# Patient Record
Sex: Female | Born: 1975 | Hispanic: Refuse to answer | Marital: Married
Health system: Western US, Academic
[De-identification: ages and names within clinical notes are randomized; demographics above are authoritative.]

---

## 2019-05-18 NOTE — Telephone Encounter (Signed)
Pt returned call and prefers in person also [place] cardi[place].

## 2019-05-18 NOTE — Telephone Encounter (Signed)
Left patient a message to return call to offer to convert her appointment with Dr. Elmon Kirschner to a video visit.    If patient returns call, please offer to convert to a video visit. Also ask if patient has seen a Cardiologist in the past to request medical records.    Thanks!

## 2019-05-19 ENCOUNTER — Ambulatory Visit: Admit: 2019-05-19 | Payer: MEDICAID | Attending: Physician | Primary: Physician

## 2019-05-19 DIAGNOSIS — E039 Hypothyroidism, unspecified: Secondary | ICD-10-CM

## 2019-05-19 DIAGNOSIS — M4802 Spinal stenosis, cervical region: Secondary | ICD-10-CM

## 2019-05-19 DIAGNOSIS — R079 Chest pain, unspecified: Secondary

## 2019-05-19 DIAGNOSIS — K589 Irritable bowel syndrome without diarrhea: Secondary | ICD-10-CM

## 2019-05-19 DIAGNOSIS — Z8249 Family history of ischemic heart disease and other diseases of the circulatory system: Secondary | ICD-10-CM

## 2019-05-19 DIAGNOSIS — R0789 Other chest pain: Secondary | ICD-10-CM

## 2019-05-19 MED ORDER — CELECOXIB 200 MG CAPSULE
200 | ORAL | Status: AC
Start: 2019-05-19 — End: ?

## 2019-05-19 MED ORDER — CYCLOBENZAPRINE 5 MG TABLET
5 | ORAL | Status: AC | PRN
Start: 2019-05-19 — End: ?

## 2019-05-19 MED ORDER — DULOXETINE 20 MG CAPSULE,DELAYED RELEASE
20 | ORAL | Status: AC
Start: 2019-05-19 — End: ?

## 2019-05-19 NOTE — Patient Instructions (Signed)
I recommend that you have an exercise stress test that should happen before your elective surgery. I will let you know about the results. Thanks!         Exercise Electrocardiogram: About This Test  What is it?     An exercise electrocardiogram (EKG or ECG) is a test that checks for changes in your heart while you exercise. Sometimes your doctor can only see heart problems during exercise or while you have symptoms.  This test is sometimes called an exercise EKG, stress test, or treadmill test.  Why is this test done?  You may need this test to check your heart's electrical activity. It can help find out if a heart problem is causing chest pain or find the cause of symptoms that happen during activity, such as dizziness or fainting. It can also help your doctor decide on the best treatment for certain heart problems.  How do you prepare for the test?   Do not smoke or eat a heavy meal before this test.   Wear flat, comfortable shoes (no bedroom slippers) and loose, lightweight shorts or sweatpants. Walking or running shoes are best.   Tell your doctor ALL the medicines, vitamins, supplements, and herbal remedies you take. Some may increase the risk of problems during your test. Your doctor will tell you if you should stop taking any of them before the test and how soon to do it.  How is the test done?  You most likely will either walk on a treadmill or pedal a stationary bicycle.  You will have a blood pressure cuff on your upper arm. Small pads or patches (electrodes) will be placed, like stickers, on your skin on each arm and leg and on your chest. Your doctor may wrap your chest with an elastic band to keep the electrodes from falling off.  On the treadmill, you will start out slowly in a level or slightly inclined position. After certain periods of time, the speed and steepness of the treadmill will be increased so that you will be walking faster and at a greater incline.   On the stationary bicycle, you will pedal fast enough to keep a certain speed. After certain periods of time, the resistance will be increased, making it harder to pedal.  In both tests:   Your EKG, heart rate, and blood pressure are recorded.   You might be asked to use numbers to say how hard you are exercising. The higher the number, the harder you think you are exercising.   The test will continue until:  ? You need to stop.  ? You have reached a target heart rate.  ? You have angina symptoms, such as chest pain or pressure.  ? You are very tired or very short of breath.  ? Your doctor feels you need to stop because of a change in your heartbeat or blood pressure.  After the test, you will be able to sit or lie down and rest. Your EKG and blood pressure will be checked for about 5 to 10 minutes.  What are the risks of the test?   There is very little chance of having a problem from this test.   Risks include:  ? Irregular heartbeats during the test.  ? Severe angina symptoms.  ? Fainting.  ? Falling.  ? Heart attack.   No electricity passes through your body during the test. There is no danger of getting an electrical shock.  How long does the test take?  The test may take about 30 to 60 minutes.  What happens after the test?   You will probably be able to go home right away. It depends on the reason for the test.   You can go back to your usual activities right away.  When should you call for help?  Call 911 anytime you think you may need emergency care. For example, call if:   You passed out (lost consciousness).   You have been diagnosed with angina, and you have angina symptoms that do not go away with rest or are not getting better within 5 minutes after you take a dose of nitroglycerin.   You have symptoms of a heart attack. These may include:  ? Chest pain or pressure, or a strange feeling in the chest.  ? Sweating.  ? Shortness of breath.  ? Nausea or vomiting.   ? Pain, pressure, or a strange feeling in the back, neck, jaw, or upper belly or in one or both shoulders or arms.  ? Lightheadedness or sudden weakness.  ? A fast or irregular pulse.  After you call 911, the operator may tell you to chew 1 adult-strength or 2 to 4 low-dose aspirin. Wait for an ambulance. Do not try to drive yourself.  Watch closely for changes in your health, and be sure to contact your doctor if you have any problems.  Follow-up care is a key part of your treatment and safety. Be sure to make and go to all appointments, and call your doctor if you are having problems. It's also a good idea to keep a list of the medicines you take. Ask your doctor when you can expect to have your test results.  Where can you learn more?  Go to [employer]  Enter Q933 in the search box to learn more about "Exercise Electrocardiogram: About This Test."  Current as of: August 31, 2020Content Version: 12.7   2006-2020 [employer].   Care instructions adapted under license by your healthcare professional. If you have questions about a medical condition or this instruction, always ask your healthcare professional. [employer], Inc[employer] disclaims any warranty or liability for your use of this information.

## 2019-05-19 NOTE — Progress Notes (Signed)
Subjective    Vickie Williams is an 44 y.o. female referred by [name], DO for chest pain.         History of Present Illness   43-yo woman with a history of cervical stenosis, IBS, hypothyroidism referred for chest pain.    She has two episodes of severe chest pain. The first episode of chest pain in 12/2018 occurred at rest. It was located at the left side of the chest and felt like a stabbing. Breathing would make the chest pain worse. It was a pain deep inside her chest. The chest pain lasted about 5-6 hours. There was associated shortness of breath. She had to go to [facility] for this. There her EKG and troponin were normal. It was thought that her chest pain was reproducible at the time. She received gabapentin and lidocaine patch and her chest pain subsequently resolved. The following day she had severe fatigue which resolved.    Then she had a second episode while in her surgeon's office in 03/2019. It was a severe left-sided chest pain that felt like something was stuck in her chest. She went to the [facility] ER but because she was not happy with her experience there, she went to the [facility] ER. By the time she went to the other ER, her chest pain had resolved. There she was diagnosed with right elbow pain.    She also endorses exertional chest discomfort and shortness of breath. This has been happening since she was in [place] in 2018. She says that when she walks up hills, she gets these symptoms. After she walks up a flight of stairs, she feels like she is going to collapse. Prior to these chest pains she was a runner and was more physically active.    She has been diagnosed with cervical myelopathy with "impending cord compression at C4-5 and C5-6" per the orthopedic surgeon. She is awaiting C4-5 and C5-6 ACDF on 3/30.    Many years ago she was told that her heart rate was low, in the 50s, but no intervention was deemed necessary.     She has had intermittent pain in both of her knees and ankles. She has been seen by rheumatology and was diagnosed with fibromyalgia.    The patient denies paroxysmal nocturnal dyspnea, orthopnea, palpitations, syncope, or presyncope.    Allergies/Contraindications   Allergen Reactions    Ibuprofen Shortness Of Breath     Abdominal pain   Abdominal pain       Mushroom Diarrhea     Cramps, diarrhea  Diarrhea/vomiting  Diarrhea/vomiting  Cramps, diarrhea      Shellfish Derived Rash     rash  rash      Fish Containing Products      Bodywide rash    Shrimp      Rash     Outpatient Encounter Medications as of 05/19/2019   Medication Sig Dispense Refill    cyclobenzaprine (FLEXERIL) 5 mg tablet Take 10 mg by mouth nightly as needed      DULoxetine (CYMBALTA) 20 mg DR capsule Take 20 mg by mouth      celecoxib (CELEBREX) 200 mg capsule Take 400 mg by mouth daily       No facility-administered encounter medications on file as of 05/19/2019.      Past Medical History:   Diagnosis Date    Cervical stenosis of spine     Irritable bowel syndrome     Thyroid disease  Past Surgical History:   Procedure Laterality Date    CESAREAN SECTION  2003    CHOLECYSTECTOMY  2017    ovary removal Right     around 2005     Family History   Problem Relation Name Age of Onset    Heart attack Father      Heart murmur Maternal Uncle      Pacemaker Maternal Grandmother         Social History     Tobacco Use    Smoking status: Never Smoker    Smokeless tobacco: Never Used   Substance and Sexual Activity    Alcohol use: Not Currently    Drug use: Never   Social History Narrative    Patient works as a IT sales professional.           Objective      Vitals      Most Recent Value   BP  121/81   Heart Rate  86   *Resp  20   SpO2  96 %   Weight  91.4 kg (201 lb 6.4 oz)   Height  160 cm (5\' 3" )   Pain Score  0   BMI (Calculated)  35.8              Physical Exam  GENERAL: Alert, well appearing, and in no acute distress.   EYES: Normal conjunctivae, no scleral icterus.    NECK: No thyromegaly.    LYMPH: No cervical lymphadenopathy.  RESPIRATORY: No respiratory distress. CTAB.  No wheezes or rhonchi.   CARDIOVASCULAR: Regular rhythm. Normal S1, S2. No murmurs, rubs, clicks or gallops. No JVD or hepatojugular reflux.  GI/ABDOMEN: Soft, nontender, nondistended and no organomegaly.  EXTREMITIES: No clubbing, cyanosis or edema.  SKIN: No clubbing or cyanosis; warm, well-perfused.    NEUROLOGIC: Alert and oriented to person, place, and situation.  Normal gait.  PSYCHIATRIC: Normal speech and affect.       Review of Prior Testing  DATA:  I have personally reviewed the following data:     ECG today: normal EKG, normal sinus rhythm, there are no previous tracings available for comparison.  I have independently reviewed and interpreted this ECG.     Assessment and Plan         [name] is a 44 y.o. female 43-yo woman with a history of cervical stenosis, IBS, hypothyroidism referred for chest pain.     1) Chest pain  - Patient apparently has had two types of chest pain, one of which she has had two discrete episodes and sounds very atypical for angina (isolated rest pains that sound more musculoskeletal or pleuritic in nature), and the other being consistently exertional. Her baseline EKG is normal. Her cardiovascular exam today is benign. Her pretest probability for CAD does not seem to be high, but I do not have a clear alternative explanation for her exertional symptoms. I recommend that she have an exercise EKG for risk stratification for CAD and to evaluate her chest pain. Given her diffuse joint pains as well as her spine disease, I am wondering if she could have some sort of an systemic inflammatory process; she has had an elevated CRP of 11.59 in 04/2019. However, her rheumatologist has deemed an autoimmune workup unnecessary.                   I reviewed external records from 3+ providers outside my specialty or healthcare organization as summarized in the note. I personally reviewed  and interpreted a test as summarized in the note.    I spent a total of 79 minutes on this patient's care on the day of their visit excluding time spent related to any billed procedures. This time includes face-to-face time with the patient as well as time spent documenting in the medical record, reviewing patient's records and tests, obtaining history, placing orders, communicating with other healthcare professionals, counseling the patient, family, or caregiver, and/or care coordination for the diagnoses above.                        I have provided the following written instructions to the patient in the After Visit Summary:   Patient Instructions   I recommend that you have an exercise stress test that should happen before your elective surgery. I will let you know about the results. Thanks!         Exercise Electrocardiogram: About This Test  What is it?     An exercise electrocardiogram (EKG or ECG) is a test that checks for changes in your heart while you exercise. Sometimes your doctor can only see heart problems during exercise or while you have symptoms.  This test is sometimes called an exercise EKG, stress test, or treadmill test.  Why is this test done?  You may need this test to check your heart's electrical activity. It can help find out if a heart problem is causing chest pain or find the cause of symptoms that happen during activity, such as dizziness or fainting. It can also help your doctor decide on the best treatment for certain heart problems.  How do you prepare for the test?   Do not smoke or eat a heavy meal before this test.   Wear flat, comfortable shoes (no bedroom slippers) and loose, lightweight shorts or sweatpants. Walking or running shoes are best.    Tell your doctor ALL the medicines, vitamins, supplements, and herbal remedies you take. Some may increase the risk of problems during your test. Your doctor will tell you if you should stop taking any of them before the test and how soon to do it.  How is the test done?  You most likely will either walk on a treadmill or pedal a stationary bicycle.  You will have a blood pressure cuff on your upper arm. Small pads or patches (electrodes) will be placed, like stickers, on your skin on each arm and leg and on your chest. Your doctor may wrap your chest with an elastic band to keep the electrodes from falling off.  On the treadmill, you will start out slowly in a level or slightly inclined position. After certain periods of time, the speed and steepness of the treadmill will be increased so that you will be walking faster and at a greater incline.  On the stationary bicycle, you will pedal fast enough to keep a certain speed. After certain periods of time, the resistance will be increased, making it harder to pedal.  In both tests:   Your EKG, heart rate, and blood pressure are recorded.   You might be asked to use numbers to say how hard you are exercising. The higher the number, the harder you think you are exercising.   The test will continue until:  ? You need to stop.  ? You have reached a target heart rate.  ? You have angina symptoms, such as chest pain or pressure.  ? You are very tired or very  short of breath.  ? Your doctor feels you need to stop because of a change in your heartbeat or blood pressure.  After the test, you will be able to sit or lie down and rest. Your EKG and blood pressure will be checked for about 5 to 10 minutes.  What are the risks of the test?   There is very little chance of having a problem from this test.   Risks include:  ? Irregular heartbeats during the test.  ? Severe angina symptoms.  ? Fainting.  ? Falling.  ? Heart attack.    No electricity passes through your body during the test. There is no danger of getting an electrical shock.  How long does the test take?  The test may take about 30 to 60 minutes.  What happens after the test?   You will probably be able to go home right away. It depends on the reason for the test.   You can go back to your usual activities right away.  When should you call for help?  Call 911 anytime you think you may need emergency care. For example, call if:   You passed out (lost consciousness).   You have been diagnosed with angina, and you have angina symptoms that do not go away with rest or are not getting better within 5 minutes after you take a dose of nitroglycerin.   You have symptoms of a heart attack. These may include:  ? Chest pain or pressure, or a strange feeling in the chest.  ? Sweating.  ? Shortness of breath.  ? Nausea or vomiting.  ? Pain, pressure, or a strange feeling in the back, neck, jaw, or upper belly or in one or both shoulders or arms.  ? Lightheadedness or sudden weakness.  ? A fast or irregular pulse.  After you call 911, the operator may tell you to chew 1 adult-strength or 2 to 4 low-dose aspirin. Wait for an ambulance. Do not try to drive yourself.  Watch closely for changes in your health, and be sure to contact your doctor if you have any problems.  Follow-up care is a key part of your treatment and safety. Be sure to make and go to all appointments, and call your doctor if you are having problems. It's also a good idea to keep a list of the medicines you take. Ask your doctor when you can expect to have your test results.  Where can you learn more?  Go to [employer]  Enter Q933 in the search box to learn more about "Exercise Electrocardiogram: About This Test."  Current as of: August 31, 2020Content Version: 12.7   2006-2020 [employer].    Care instructions adapted under license by your healthcare professional. If you have questions about a medical condition or this instruction, always ask your healthcare professional. [employer] disclaims any warranty or liability for your use of this information.

## 2019-05-22 LAB — ECG 12-LEAD
Atrial Rate: 80 {beats}/min
Calculated P Axis: 70 degrees
Calculated R Axis: 23 degrees
Calculated T Axis: 35 degrees
P-R Interval: 134 ms
QRS Duration: 84 ms
QT Interval: 370 ms
QTcb: 426 ms
Ventricular Rate: 80 {beats}/min

## 2019-05-24 ENCOUNTER — Ambulatory Visit: Admit: 2019-05-24 | Payer: MEDICAID | Primary: Physician

## 2019-05-24 DIAGNOSIS — R079 Chest pain, unspecified: Secondary | ICD-10-CM

## 2019-05-24 LAB — ECG STRESS REPORT

## 2019-05-24 NOTE — Progress Notes (Signed)
CARDIAC STRESS TEST PRE-PROCEDURE Interview    Referring Provider  Dr. Cannon Kettle    Procedure Planned  Treadmill Stress Test    Indication  Chest Pain    HPI  44 yo female with a medical hx that includes cervical stenosis, IBS, 7 hypothyroidism, here today for an exercise treadmill test with ECG to rule out ischemia.  Patient reports exertional chest pain that radiates to her neck & back.  She associates this with SOB as well as lightheadedness.  It takes a few hours for her symptoms to resolve with rest.  Also c/o sharp stabbing pain to the chest different from the sensation she experiences with activity.  This worsens with deep breaths and is fleeting.  Denies palpitations, dyspnea at rest, pre-syncope, or syncope.  Patient has not had anything to eat or drink for at least 3 hours except sips of water.    Exercise  Walking      PMH  Past Medical History:   Diagnosis Date    Cervical stenosis of spine     Irritable bowel syndrome     Thyroid disease        Family Hx  No known family hx of early heart disease    Allergies  Allergies/Contraindications   Allergen Reactions    Ibuprofen Shortness Of Breath     Abdominal pain   Abdominal pain       Mushroom Diarrhea     Cramps, diarrhea  Diarrhea/vomiting  Diarrhea/vomiting  Cramps, diarrhea      Shellfish Derived Rash     rash  rash      Fish Containing Products      Bodywide rash    Shrimp      Rash     Reviewed & verified.    Falls Hx  None    Medications  Current Medications       Dosage    celecoxib (CELEBREX) 200 mg capsule Take 400 mg by mouth daily    cyclobenzaprine (FLEXERIL) 5 mg tablet Take 10 mg by mouth nightly as needed    DULoxetine (CYMBALTA) 20 mg DR capsule Take 20 mg by mouth            Consent  Risks, benefits, and alternatives of procedure explained.  Discussed fall prevention while on the treadmill.  Consent obtained by Dr. Harrel Carina    Comments/Results  See ECG stress note for final report.     Jillyn Hidden, RN  Clinical Nurse   [facility]   [facility]  (phone) [phone]

## 2019-11-17 ENCOUNTER — Inpatient Hospital Stay: Admit: 2019-11-17 | Payer: MEDICAID | Primary: Physician

## 2019-11-17 DIAGNOSIS — N946 Dysmenorrhea, unspecified: Secondary

## 2019-12-18 ENCOUNTER — Inpatient Hospital Stay: Admit: 2019-12-18 | Payer: MEDICAID | Primary: Physician

## 2019-12-18 DIAGNOSIS — Z1231 Encounter for screening mammogram for malignant neoplasm of breast: Secondary

## 2022-04-17 IMAGING — [facility] [HOSPITAL]^LOMBAR
6 series · 48 of 48 positions shown · non-contrast
Comparison: none

[Series 2: coronal_t2_quiet · coronal · 5.5mm · 1.09mm/px · 8 of 15 slices shown]
[im 1/15]
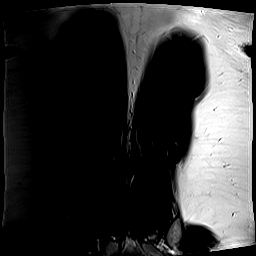
[im 3/15]
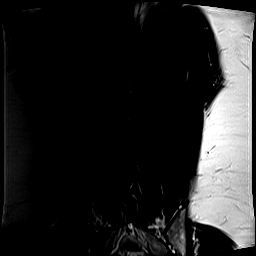
[im 5/15]
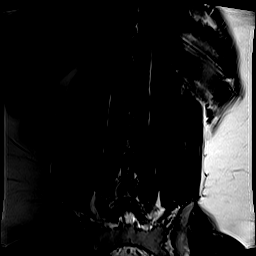
[im 7/15]
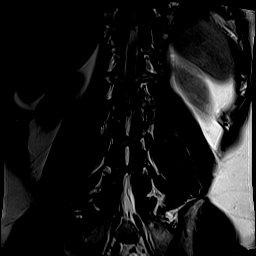
[im 9/15]
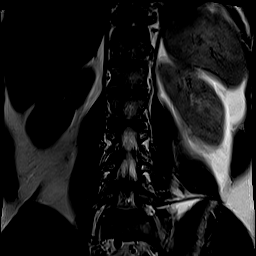
[im 11/15]
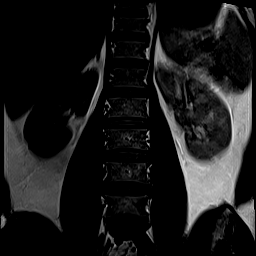
[im 13/15]
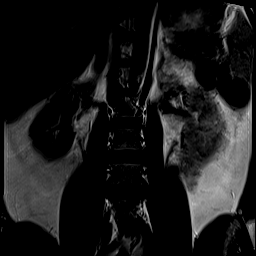
[im 15/15]
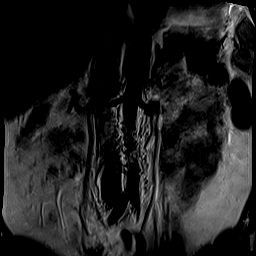

[Series 3: sagital_t2_quiet · sagittal · 4.0mm · 0.84mm/px · 5 of 12 slices shown]
[im 1/12]
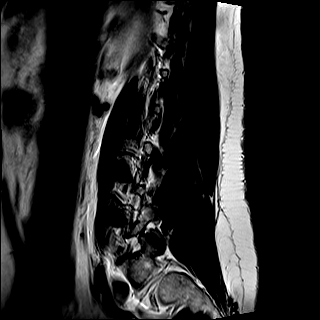
[im 3/12]
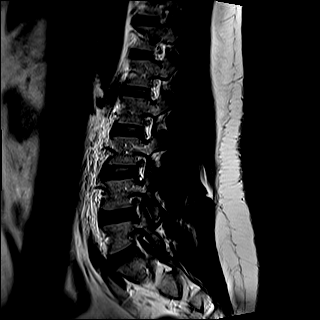
[im 6/12]
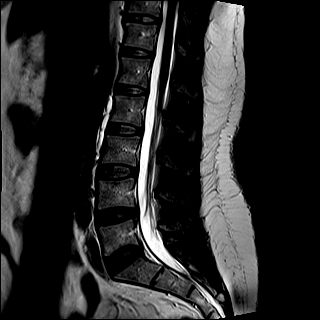
[im 9/12]
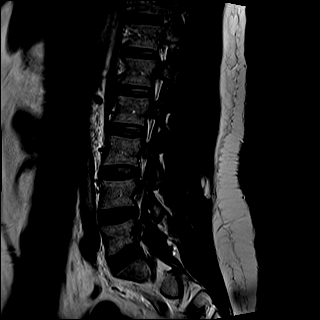
[im 12/12]
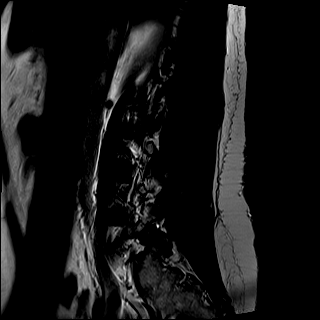

[Series 4: sagital_stir_quiet · sagittal · 4.0mm · 1.05mm/px · 5 of 12 slices shown]
[im 1/12]
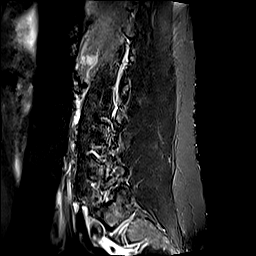
[im 3/12]
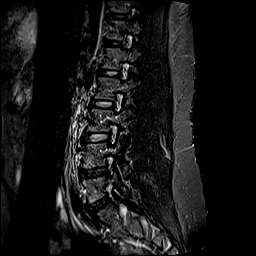
[im 6/12]
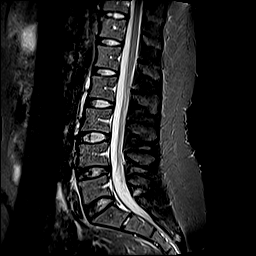
[im 9/12]
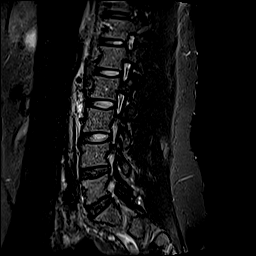
[im 12/12]
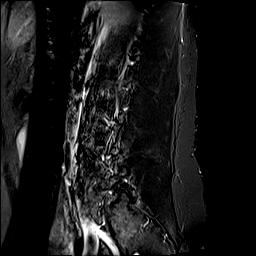

[Series 5: sagital_t1_quiet · sagittal · 4.0mm · 0.84mm/px · 5 of 12 slices shown]
[im 1/12]
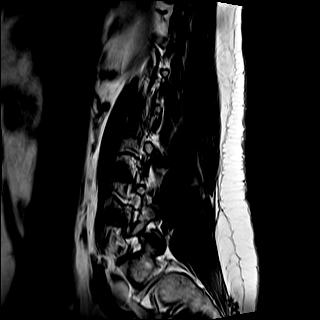
[im 3/12]
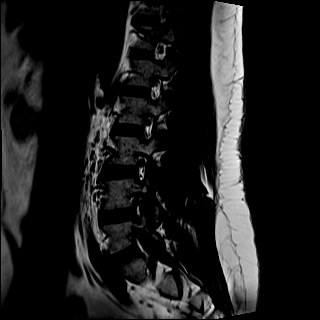
[im 6/12]
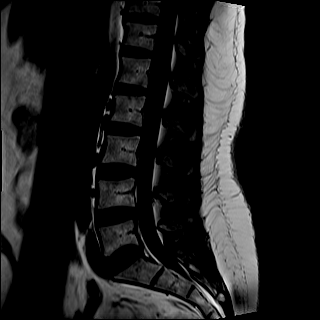
[im 9/12]
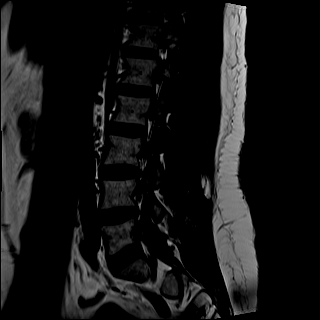
[im 12/12]
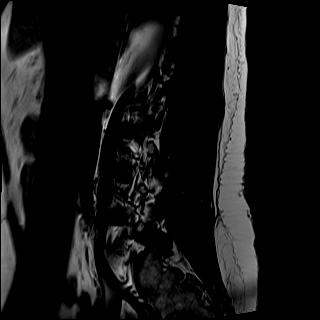

[Series 6: axial_t2_quiet · axial · 4.0mm · 0.69mm/px · z∈[-143,+75]mm · 11 of 25 slices shown]
[im 1/25]
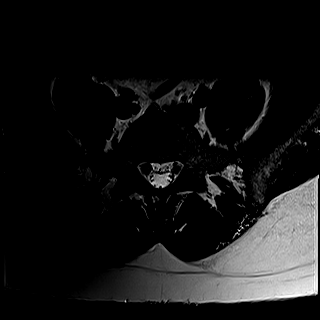
[im 3/25]
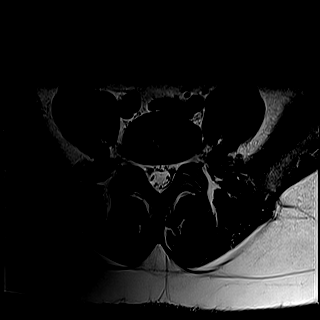
[im 5/25]
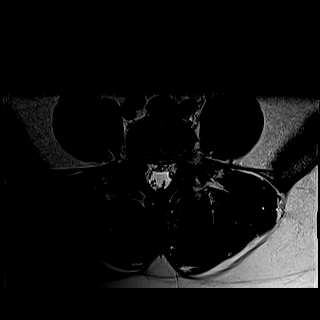
[im 8/25]
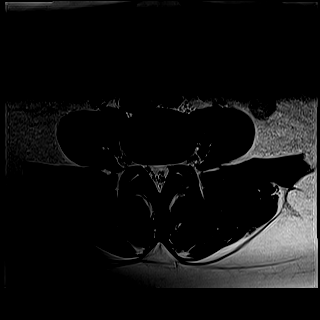
[im 10/25]
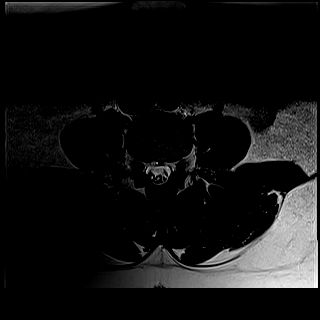
[im 13/25]
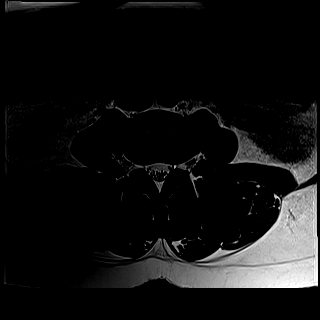
[im 15/25]
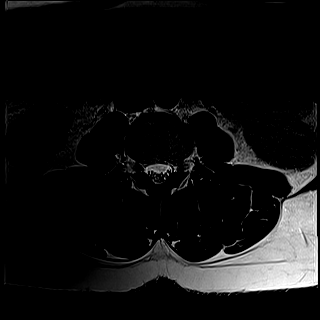
[im 17/25]
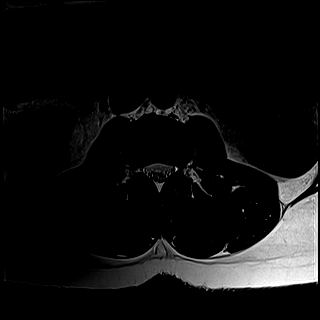
[im 20/25]
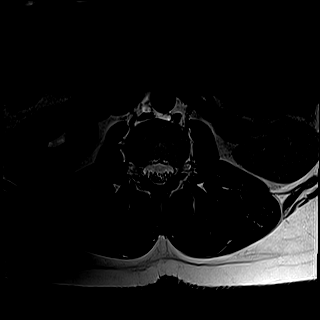
[im 22/25]
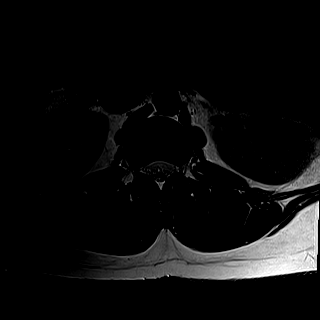
[im 25/25]
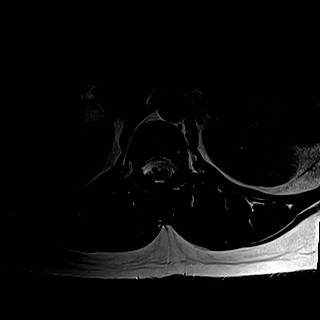

[Series 7: axial_t2_tse_bloco · axial · 4.0mm · 0.69mm/px · z∈[-116,+52]mm · 14 of 32 slices shown]
[im 1/32]
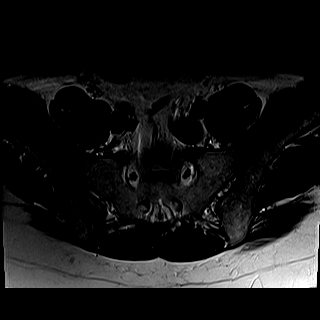
[im 3/32]
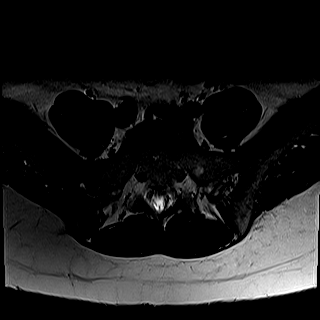
[im 5/32]
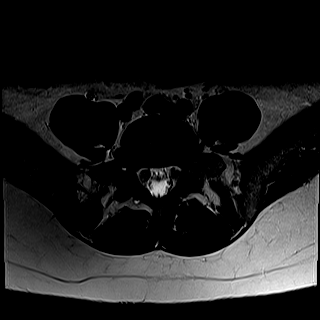
[im 8/32]
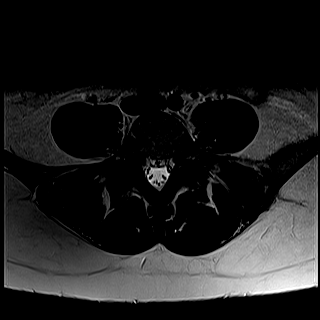
[im 10/32]
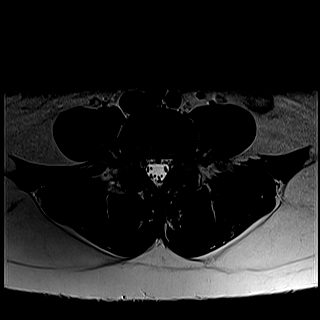
[im 12/32]
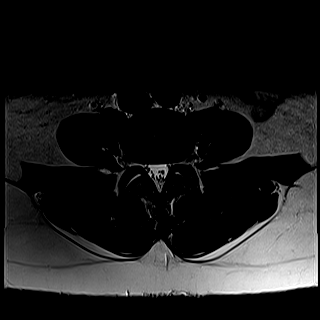
[im 15/32]
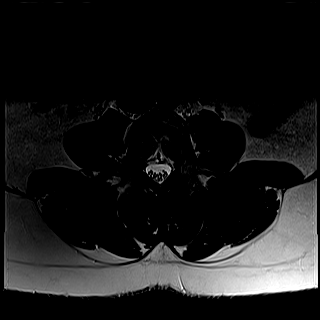
[im 17/32]
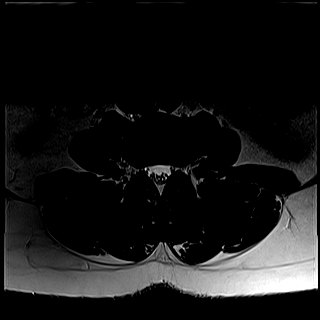
[im 20/32]
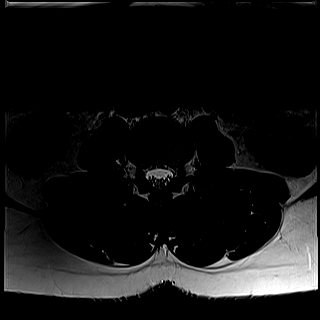
[im 22/32]
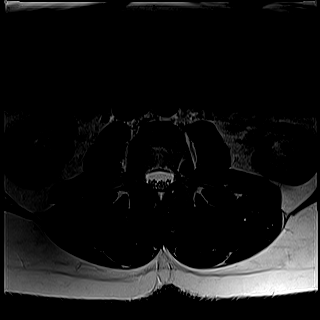
[im 24/32]
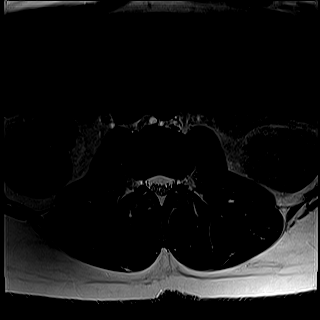
[im 27/32]
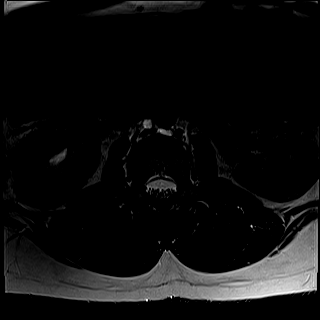
[im 29/32]
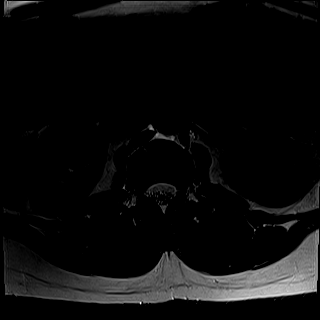
[im 32/32]
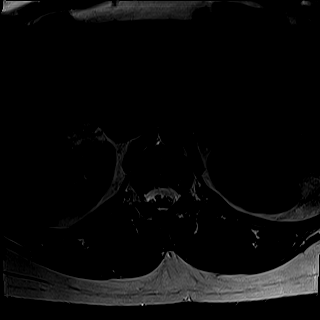

[48 of 48 positions shown; findings below may reference images not displayed]

RESSONÂNCIA MAGNÉTICA  [facility] LOMBOSSACRA

TÉCNICA:
Realizadas sequências FSE (fast-spin-eco), ponderadas em T1 e T2 nos planos sagital e axial.

RESULTADO:
Bom alinhamento dos corpos vertebrais lombares.
Acentuação da lordose lombar em decúbito.
Corpos vertebrais com alturas preservadas.
Osteofitose nos corpos vertebrais lombares.
Pedículos, laminas, processos transversos e espinhosos preservados.
Artrose interfacetária em L4-L5.
Sinais de desidratação discal em L5-S1. 
Nível L4-L5: Abaulamento discal simétrico com fissura do ânulo fibroso, retificando a face ventral do saco dural, sem conflitos radiculares.
Nível L5-S1: Abaulamento discal simétrico , retificando a face ventral do saco dural, sem conflitos radiculares.
Demais discos intervertebrais sem alterações significativas.
Canal vertebral e forames intervertebrais com amplitude preservada nos demais segmentos.
Espaço liquórico livre nos demais segmentos.
Raízes nervosas foraminais preservadas.
Cone medular com contornos regulares e sinal homogêneo.
Partes moles paravertebrais com aspecto preservado.

CONCLUSÃO:
Espondiloartropatia degenerativa lombar.
Discopatia degenerativa acima descrita.

## 2022-04-17 IMAGING — [facility] [HOSPITAL]^CERVICAL
4 of 5 series · 20 of 48 positions shown · non-contrast
Comparison: none

[Series 2: coronal_t2_quiet · coronal · 5.0mm · 0.88mm/px · 8 of 14 slices shown]
[im 1/14]
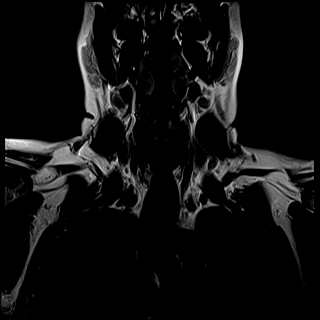
[im 2/14]
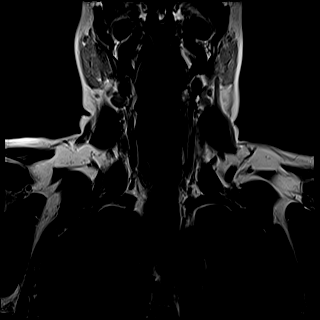
[im 4/14]
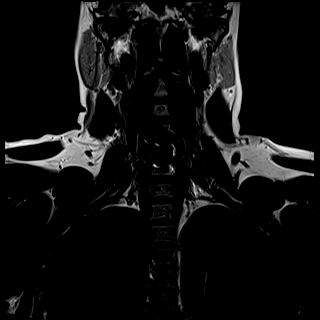
[im 6/14]
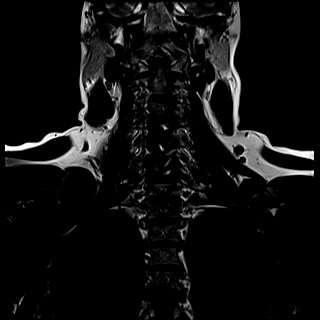
[im 8/14]
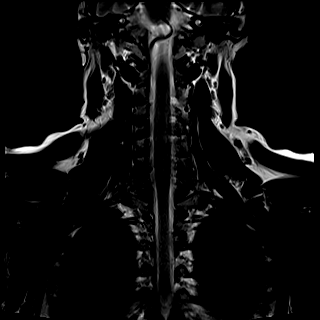
[im 10/14]
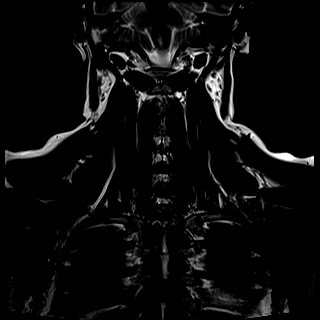
[im 12/14]
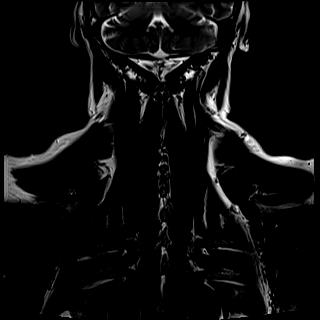
[im 14/14]
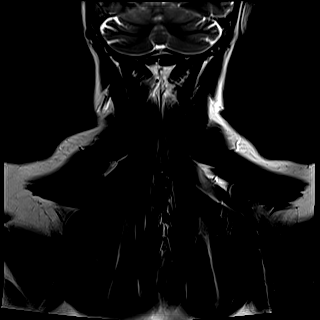

[Series 3: sagital_t2_tse · sagittal · 3.0mm · 0.27mm/px · 6 of 12 slices shown]
[im 1/12]
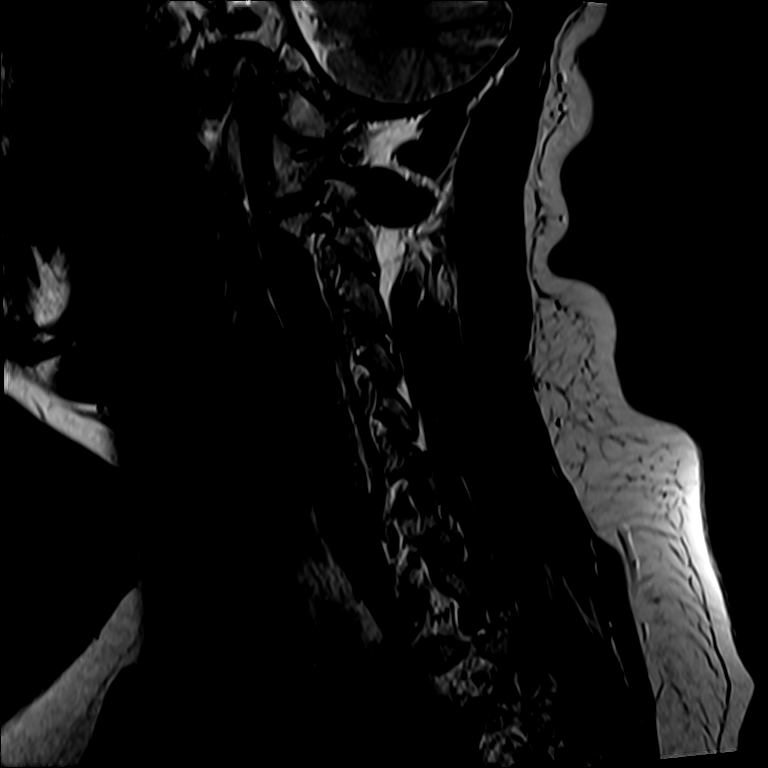
[im 2/12]
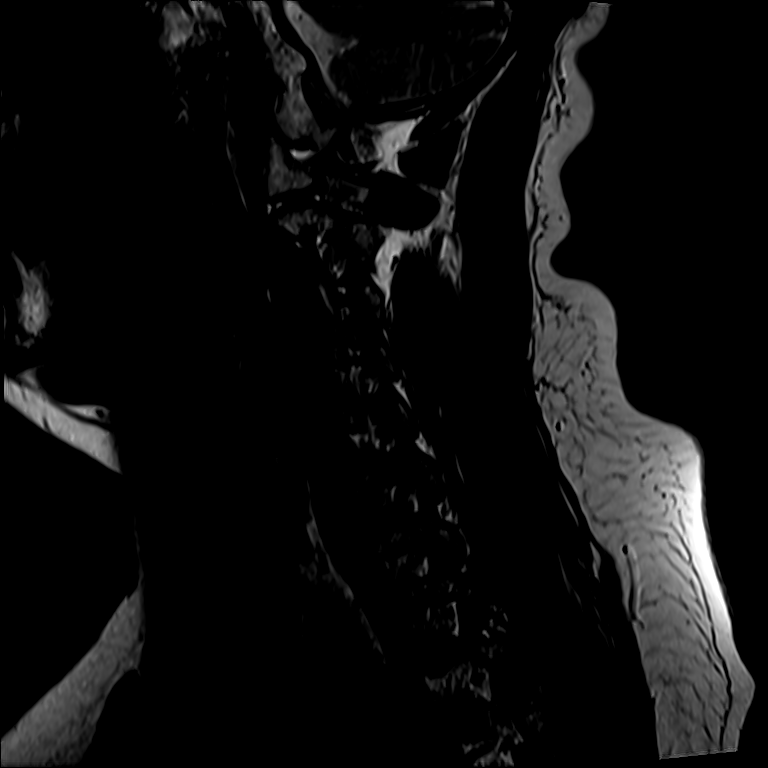
[im 4/12]
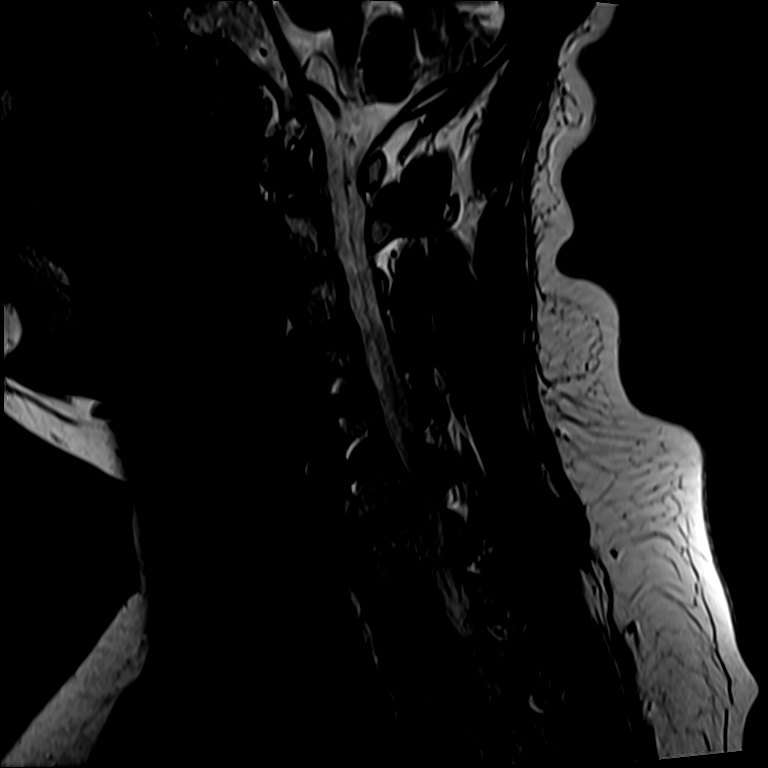
[im 5/12]
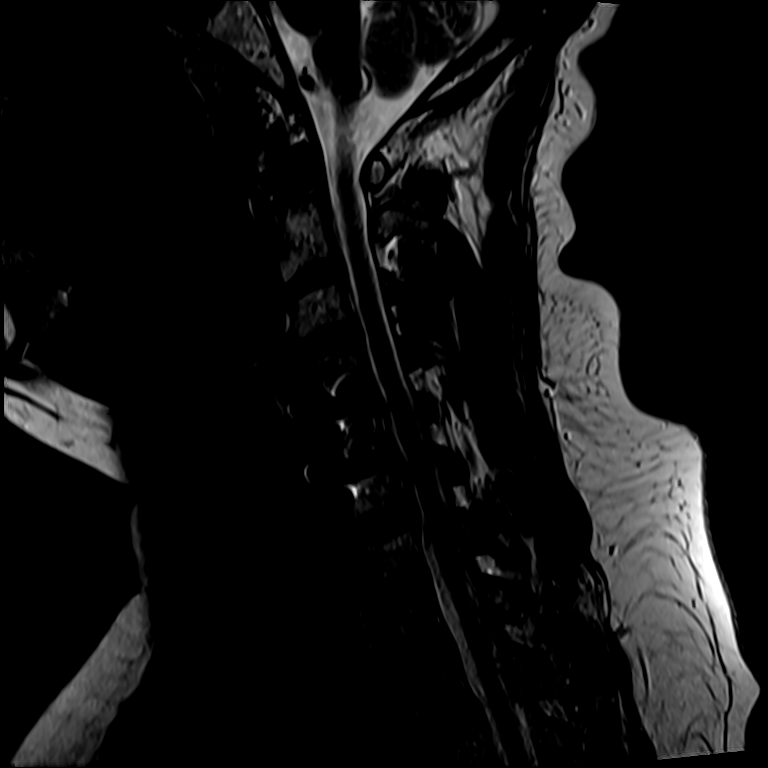
[im 7/12]
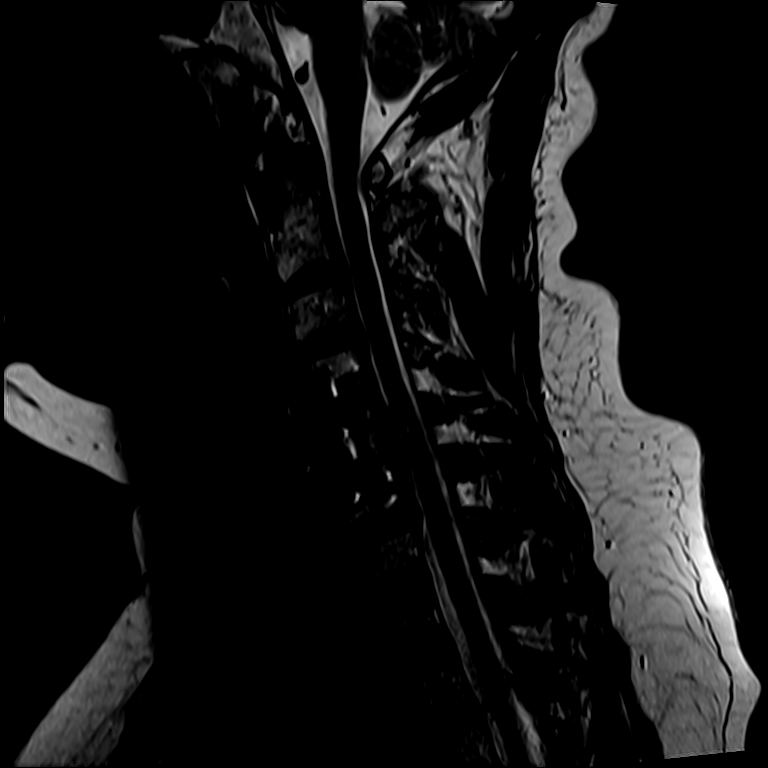
[im 10/12]
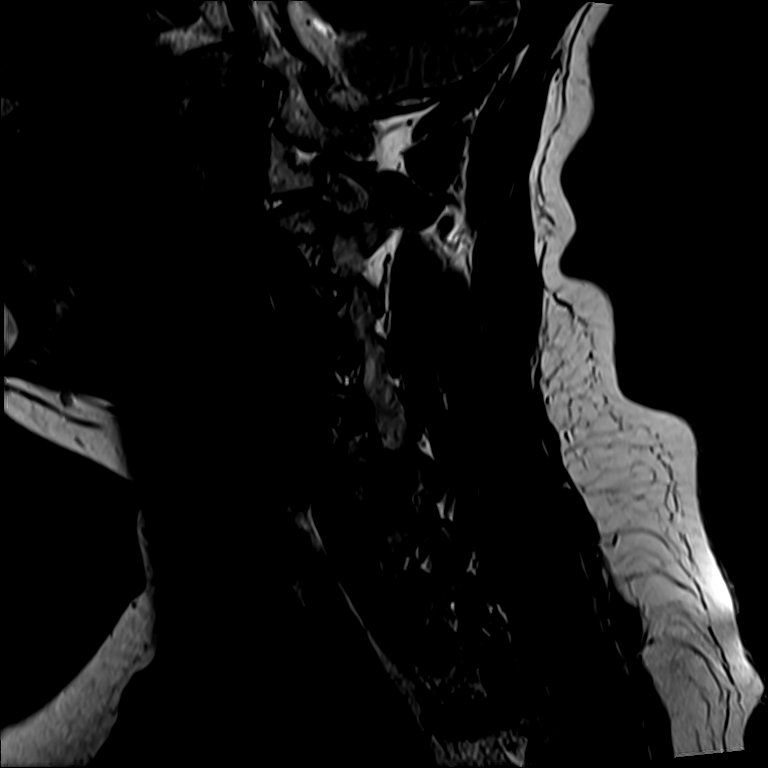

[Series 4: sagital_t1_tse · sagittal · 3.0mm · 0.66mm/px · 3 of 12 slices shown]
[im 2/12]
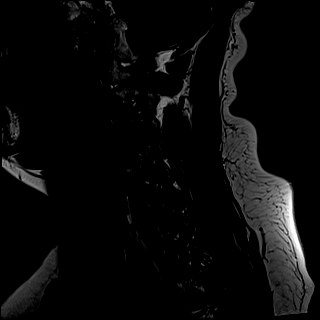
[im 7/12]
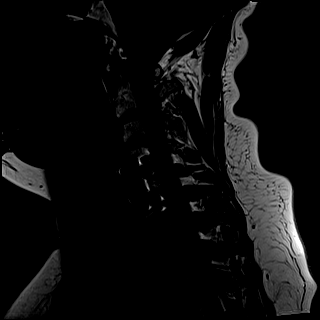
[im 10/12]
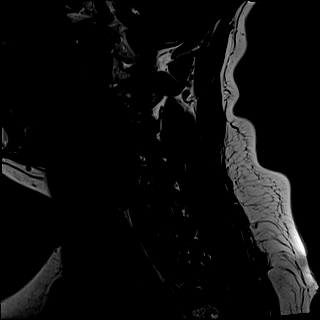

[Series 5: sagital_t2_stir · sagittal · 3.0mm · 0.82mm/px · 3 of 12 slices shown]
[im 2/12]
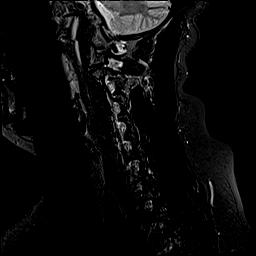
[im 7/12]
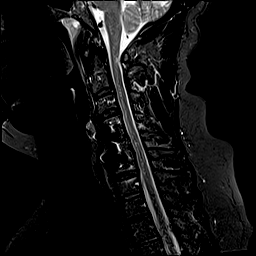
[im 10/12]
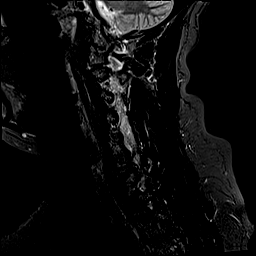

[20 of 48 positions shown; findings below may reference images not displayed]

RESSONÂNCIA MAGNÉTICA  [facility] CERVICAL

TÉCNICA:
Realizadas sequências FSE (fast-spin-eco), ponderadas em T1 e T2, no plano sagital e sequências ponderadas em T2 no plano axial.

RESULTADO:
Órteses discais em C4-C5 e C5-C6, gerando artefatos de susceptibilidade magnética que degradam algumas imagens.
Bom alinhamento dos corpos vertebrais cervicais.
Retificação da lordose cervical em decúbito.
Osteofitose dos corpos vertebrais cervicais.
Corpos vertebrais cervicais com alturas preservadas.
Pedículos, laminas, processos transversos e espinhosos preservados.
Articulações uncovertebrais e interfacetárias preservadas.
Abaulamentos discais simétricos em C3-C4 e C6-C7, retificando a face ventral do saco dural.
Demais discos intervertebrais sem alterações significativas.
Espaço liquórico livre nos demais segmentos.
Medula espinhal com contornos regulares e sinal homogêneo.
Canal vertebral e forames intervertebrais com amplitude preservada nos demais segmentos.
Transição crânio-cervical sem alterações.
Partes moles paravertebrais com aspecto preservado.

CONCLUSÃO:
Órteses discais em C4-C5 e C5-C6, gerando artefatos de susceptibilidade magnética que degradam algumas imagens.
Espondilose cervical.
Discopatia degenerativa acima descrita.

## 2023-02-26 IMAGING — CR Imaging study
8 of 10 series · 8 of 10 positions shown · non-contrast
Comparison: none

[oblique (1 of 2)]
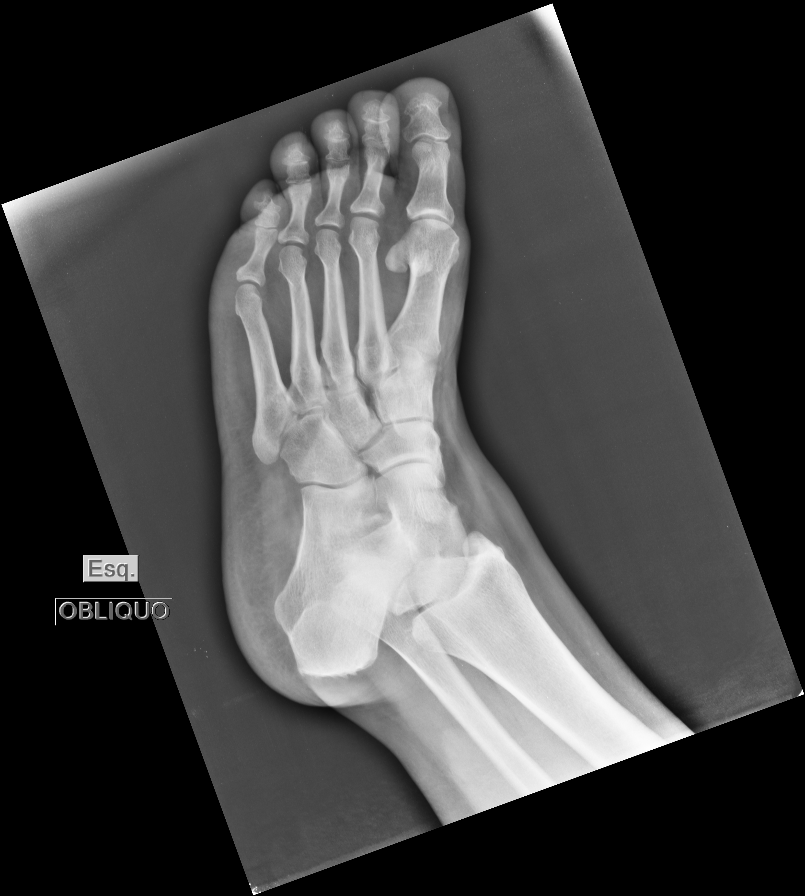

[oblique (2 of 2)]
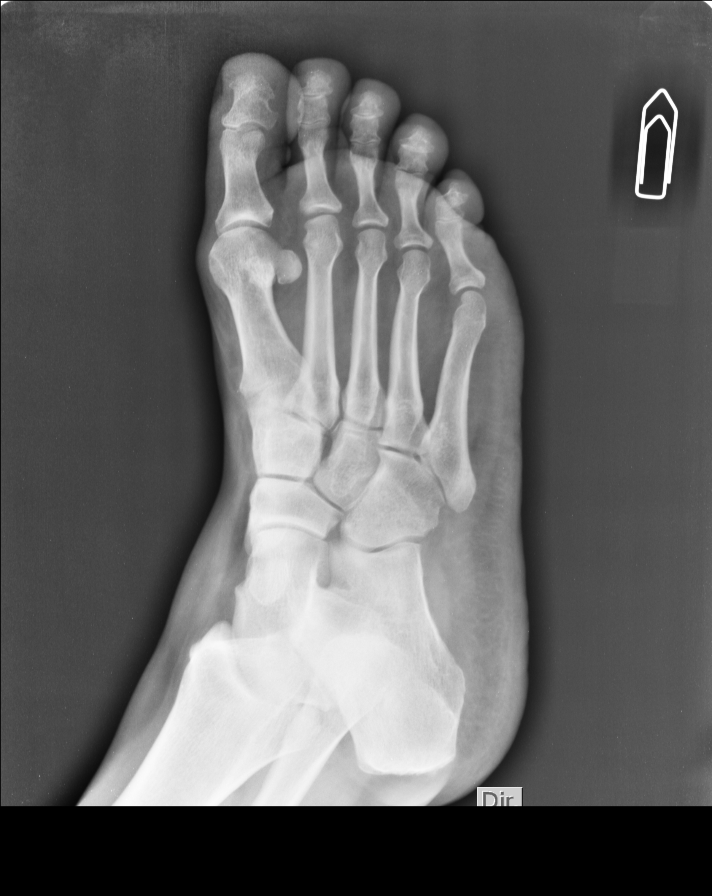

[AP (1 of 6)]
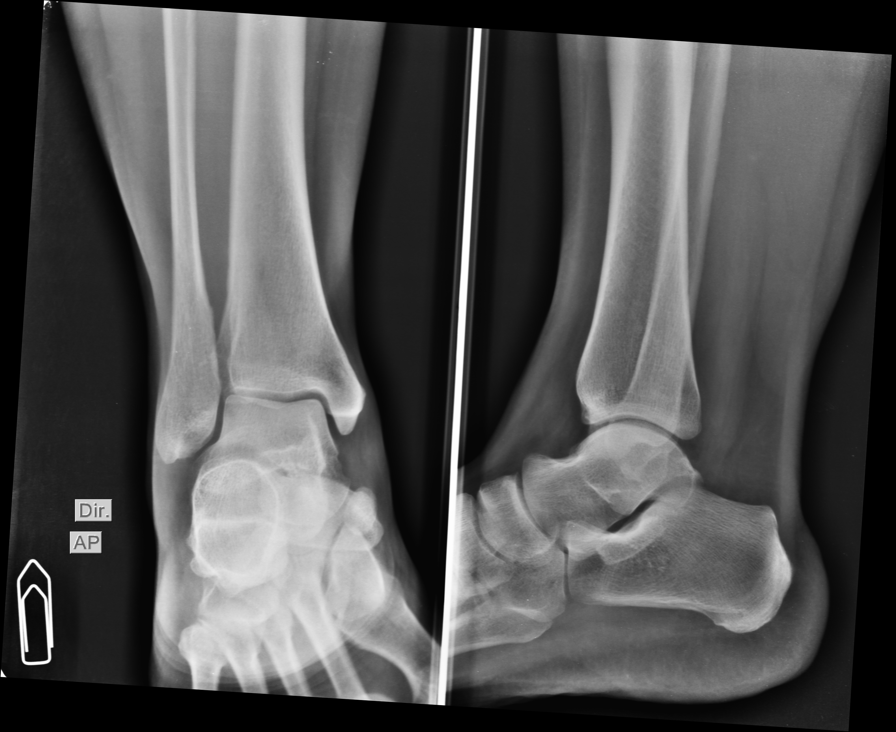

[AP (2 of 6)]
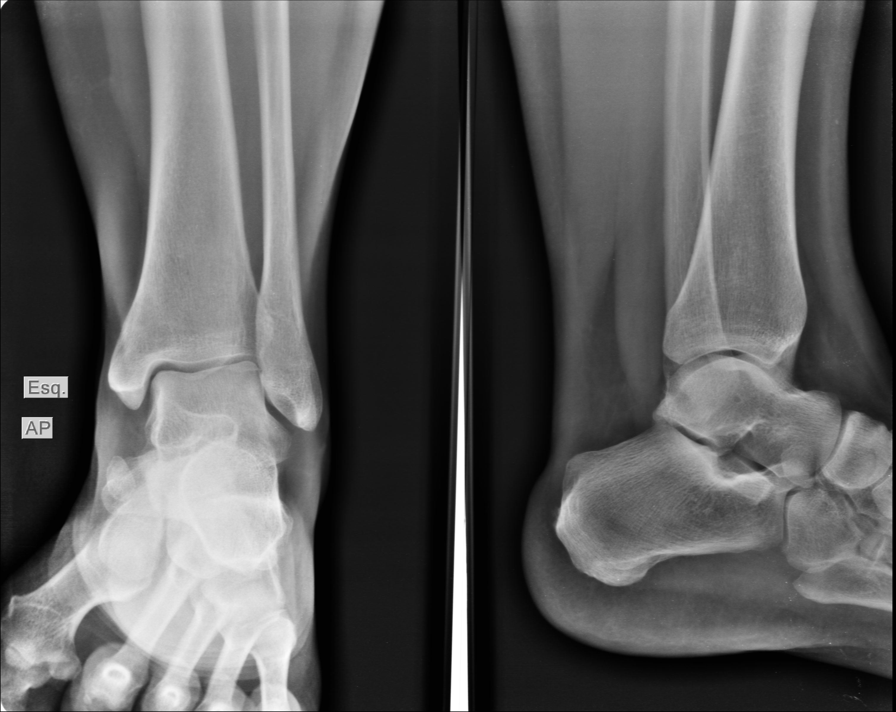

[AP (3 of 6)]
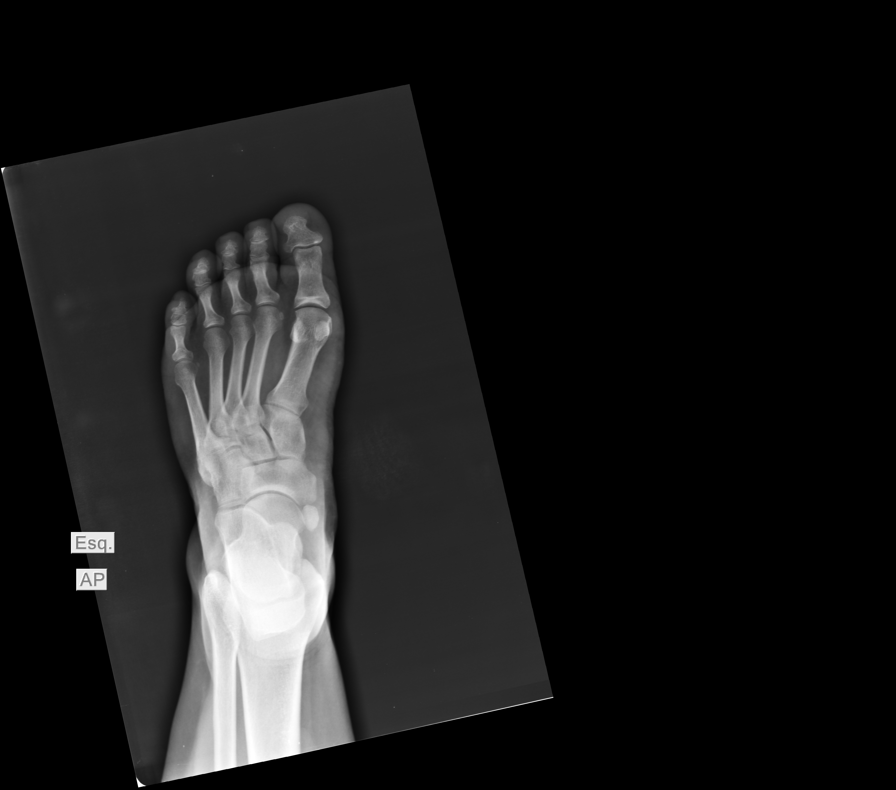

[AP (4 of 6)]
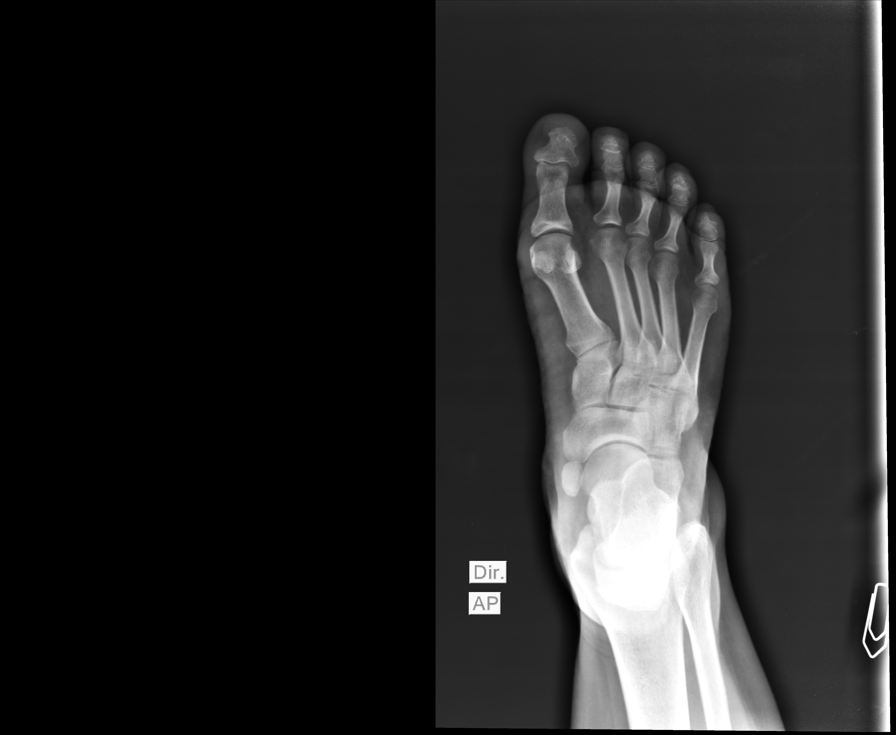

[AP (5 of 6)]
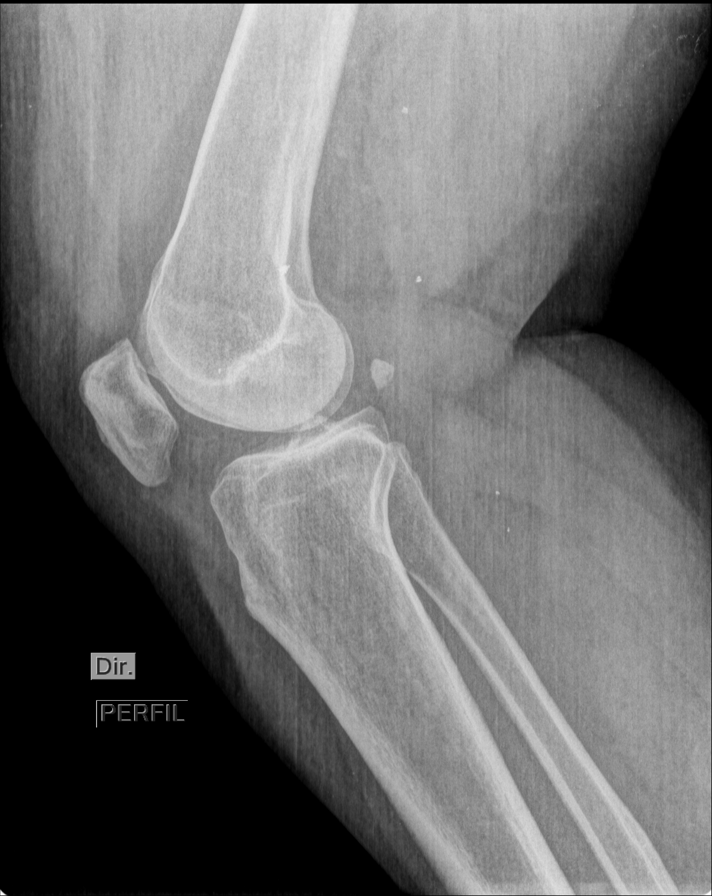

[AP (6 of 6)]
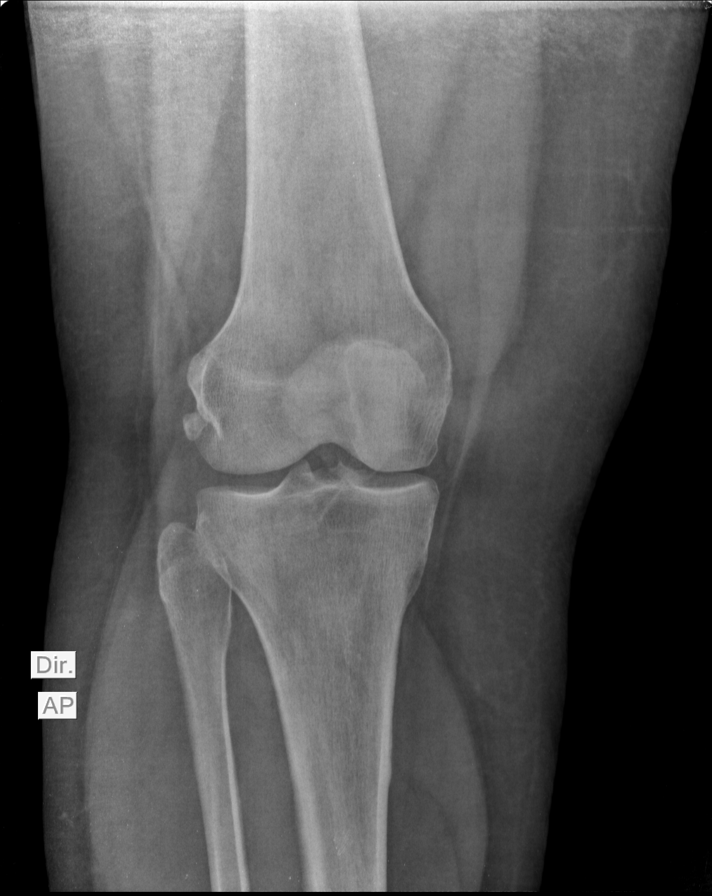

[8 of 10 positions shown; findings below may reference images not displayed]

RADIOGRAFIAS DO TORNOZELO DIREITO

Estruturas ósseas visualizadas íntegras.
Superfícies articulares íntegras, com espaços conservados.
